# Patient Record
Sex: Female | Born: 2000 | Race: Black or African American | Hispanic: No | Marital: Single | Smoking: Never smoker
Health system: Southern US, Community
[De-identification: ages and names within clinical notes are randomized; demographics above are authoritative.]

## PROBLEM LIST (undated history)

## (undated) HISTORY — PX: MOUTH SURGERY: SHX715

---
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
## 2018-05-12 ENCOUNTER — Encounter: Payer: Self-pay | Admitting: Obstetrics and Gynecology

## 2018-05-12 ENCOUNTER — Ambulatory Visit (INDEPENDENT_AMBULATORY_CARE_PROVIDER_SITE_OTHER): Payer: Medicaid Other | Admitting: Obstetrics and Gynecology

## 2018-05-12 VITALS — BP 106/69 | HR 83 | Ht 69.0 in | Wt 142.0 lb

## 2018-05-12 DIAGNOSIS — Z309 Encounter for contraceptive management, unspecified: Secondary | ICD-10-CM

## 2018-05-12 DIAGNOSIS — Z3202 Encounter for pregnancy test, result negative: Secondary | ICD-10-CM

## 2018-05-12 DIAGNOSIS — Z3046 Encounter for surveillance of implantable subdermal contraceptive: Secondary | ICD-10-CM | POA: Diagnosis not present

## 2018-05-12 LAB — POCT URINE PREGNANCY: PREG TEST UR: NEGATIVE

## 2018-05-12 MED ORDER — ETONOGESTREL 68 MG ~~LOC~~ IMPL
68.0000 mg | DRUG_IMPLANT | Freq: Once | SUBCUTANEOUS | Status: AC
  Administered 2018-05-12: 68 mg via SUBCUTANEOUS

## 2018-05-12 NOTE — Progress Notes (Signed)
RGYN patient presents for consultation to discuss birth control options.  Pt is currently on Sprintec pt wants different method because she is not consistent w/ taking pills everyday.   Pt admits to not taking last 3-4 pills.  And has skipped pills before.   LMP: 04/11/18 when asked to leave urine sample pt states period has started today.    UPT:  NEGATIVE

## 2018-05-12 NOTE — Progress Notes (Signed)
     GYNECOLOGY OFFICE PROCEDURE NOTE  Glenda Fitzpatrick is a 17 y.o. G0P0000 here for Nexplanon insertion. No other gynecologic concerns. Denies unprotected intercourse within the last 14 days. UPT: negative  Reviewed risks of insertion of implant including risk of infection, bleeding, damage to surrounding tissues and organs, migration of implant, difficult removal. She verbalizes understanding and affirms desire to proceed. Consent signed.   Nexplanon Insertion Procedure Patient identified, informed consent performed, consent signed.   Patient does understand that irregular bleeding is a very common side effect of this medication. She was advised to have backup contraception for one week after placement. Pregnancy test in clinic today was negative.  An adequate timeout was performed.  Patient's left arm was prepped and draped in the usual sterile fashion. The ruler used to measure and mark insertion area.  Patient was prepped with alcohol swab and then injected with 3 ml of 1% lidocaine.  She was prepped with betadine, Nexplanon removed from packaging,  Device confirmed in needle, then inserted full length of needle and withdrawn per handbook instructions. Nexplanon was able to palpated in the patient's arm; patient palpated the insert herself. There was minimal blood loss.  Patient insertion site covered with guaze and a pressure bandage to reduce any bruising.  The patient tolerated the procedure well and was given post procedure instructions.    Device Info Exp: 08/2020 Lot#: [id number]  K. Therese SarahMeryl Ardit Danh, M.D. Attending Obstetrician & Gynecologist, [facility], [facility]: [facility]

## 2018-05-12 NOTE — Progress Notes (Signed)
   GYNECOLOGY OFFICE FOLLOW UP NOTE  History:  17 y.o. G0P0000 here today for follow up for counseling regarding contraception. Has been on OCPs and she is not good about taking pills.    History reviewed. No pertinent past medical history.  Past Surgical History:  Procedure Laterality Date  . MOUTH SURGERY      Current Outpatient Medications:  .  ibuprofen (ADVIL,MOTRIN) 800 MG tablet, Take 1 tablet (800 mg total) by mouth every 8 (eight) hours as needed., Disp: 60 tablet, Rfl: 3 .  norgestimate-ethinyl estradiol (ORTHO-CYCLEN,SPRINTEC,PREVIFEM) 0.25-35 MG-MCG tablet, Take 1 tablet by mouth daily., Disp: 3 Package, Rfl: 5  The following portions of the patient's history were reviewed and updated as appropriate: allergies, current medications, past family history, past medical history, past social history, past surgical history and problem list.   Review of Systems:  Pertinent items noted in HPI and remainder of comprehensive ROS otherwise negative.   Objective:  Physical Exam BP 106/69   Pulse 83   Ht 5\' 9"  (1.753 m)   Wt 142 lb (64.4 kg)   LMP 04/11/2018 (Approximate)   BMI 20.97 kg/m  CONSTITUTIONAL: Well-developed, well-nourished female in no acute distress.  HENT:  Normocephalic, atraumatic. External right and left ear normal. Oropharynx is clear and moist EYES: Conjunctivae and EOM are normal. Pupils are equal, round, and reactive to light. No scleral icterus.  NECK: Normal range of motion, supple, no masses SKIN: Skin is warm and dry. No rash noted. Not diaphoretic. No erythema. No pallor. NEUROLOGIC: Alert and oriented to person, place, and time. Normal reflexes, muscle tone coordination. No cranial nerve deficit noted. PSYCHIATRIC: Normal mood and affect. Normal behavior. Normal judgment and thought content. CARDIOVASCULAR: Normal heart rate noted RESPIRATORY: Effort normal, no problems with respiration noted ABDOMEN: Soft, no distention noted.   PELVIC:  Deferred MUSCULOSKELETAL: Normal range of motion. No edema noted.  Labs and Imaging No results found.  Assessment & Plan:  1. Encounter for contraceptive management, unspecified type Patient desires nexplanon for contraception, reviewed risks/benefits, she is agreeable. Consent signed.  - POCT urine pregnancy   Routine preventative health maintenance measures emphasized. Please refer to After Visit Summary for other counseling recommendations.   Return in about 1 year (around 05/13/2019), or if symptoms worsen or fail to improve.    Baldemar LenisK. Meryl August Longest, M.D. Attending Obstetrician & Gynecologist, [facility], [facility]

## 2018-05-30 ENCOUNTER — Telehealth: Payer: Self-pay

## 2018-05-30 NOTE — Telephone Encounter (Signed)
TC from pt mother regarding pt sx's since nexplanon insertion. Pt c/o : nausea and vomiting and increased fatigue.(pt states she does not want to go out the house) Pt states she is taking oral contraceptives to stop bleeding Pt states she is only having light spotting. I consulted w/ provider and advice to pt was to discontinue oral contraceptives and monitor sx's and pt made aware can have spotting 3-6 months.  Pt states w/o taking BCP's the sx's are worse. I asked pt if she wanted nexplanon removed  pt wants removed.and wants to go back to taking oral contraception only Pt advised to make an appt And discuss w/ provider since her mother advised her to get Nexplanon due to not staying consistent w/ taking birth control pills. Pt will be returning to college in Aug 2019.

## 2018-06-10 ENCOUNTER — Encounter: Payer: Self-pay | Admitting: General Practice

## 2018-06-10 ENCOUNTER — Encounter: Payer: Self-pay | Admitting: Medical

## 2018-06-10 ENCOUNTER — Ambulatory Visit (INDEPENDENT_AMBULATORY_CARE_PROVIDER_SITE_OTHER): Payer: Medicaid Other | Admitting: Medical

## 2018-06-10 VITALS — BP 111/72 | HR 91 | Ht 69.0 in | Wt 138.0 lb

## 2018-06-10 DIAGNOSIS — Z3009 Encounter for other general counseling and advice on contraception: Secondary | ICD-10-CM

## 2018-06-10 DIAGNOSIS — Z3046 Encounter for surveillance of implantable subdermal contraceptive: Secondary | ICD-10-CM

## 2018-06-10 MED ORDER — NORGESTIMATE-ETH ESTRADIOL 0.25-35 MG-MCG PO TABS: 1.0000 | ORAL_TABLET | Freq: Every day | ORAL | 5 refills | Status: AC

## 2018-06-10 NOTE — Progress Notes (Signed)
Pt here for nexplanon removal. Pt would like to continue on the sprintec

## 2018-06-10 NOTE — Patient Instructions (Signed)
Oral Contraception Information Oral contraceptive pills (OCPs) are medicines taken to prevent pregnancy. OCPs work by preventing the ovaries from releasing eggs. The hormones in OCPs also cause the cervical mucus to thicken, preventing the sperm from entering the uterus. The hormones also cause the uterine lining to become thin, not allowing a fertilized egg to attach to the inside of the uterus. OCPs are highly effective when taken exactly as prescribed. However, OCPs do not prevent sexually transmitted diseases (STDs). Safe sex practices, such as using condoms along with the pill, can help prevent STDs. Before taking the pill, you may have a physical exam and Pap test. Your health care provider may order blood tests. The health care provider will make sure you are a good candidate for oral contraception. Discuss with your health care provider the possible side effects of the OCP you may be prescribed. When starting an OCP, it can take 2 to 3 months for the body to adjust to the changes in hormone levels in your body. Types of oral contraception  The combination pill-This pill contains estrogen and progestin (synthetic progesterone) hormones. The combination pill comes in 21-day, 28-day, or 91-day packs. Some types of combination pills are meant to be taken continuously (365-day pills). With 21-day packs, you do not take pills for 7 days after the last pill. With 28-day packs, the pill is taken every day. The last 7 pills are without hormones. Certain types of pills have more than 21 hormone-containing pills. With 91-day packs, the first 84 pills contain both hormones, and the last 7 pills contain no hormones or contain estrogen only.  The minipill-This pill contains the progesterone hormone only. The pill is taken every day continuously. It is very important to take the pill at the same time each day. The minipill comes in packs of 28 pills. All 28 pills contain the hormone. Advantages of oral  contraceptive pills  Decreases premenstrual symptoms.  Treats menstrual period cramps.  Regulates the menstrual cycle.  Decreases a heavy menstrual flow.  May treatacne, depending on the type of pill.  Treats abnormal uterine bleeding.  Treats polycystic ovarian syndrome.  Treats endometriosis.  Can be used as emergency contraception. Things that can make oral contraceptive pills less effective OCPs can be less effective if:  You forget to take the pill at the same time every day.  You have a stomach or intestinal disease that lessens the absorption of the pill.  You take OCPs with other medicines that make OCPs less effective, such as antibiotics, certain HIV medicines, and some seizure medicines.  You take expired OCPs.  You forget to restart the pill on day 7, when using the packs of 21 pills.  Risks associated with oral contraceptive pills Oral contraceptive pills can sometimes cause side effects, such as:  Headache.  Nausea.  Breast tenderness.  Irregular bleeding or spotting.  Combination pills are also associated with a small increased risk of:  Blood clots.  Heart attack.  Stroke.  This information is not intended to replace advice given to you by your health care provider. Make sure you discuss any questions you have with your health care provider. Document Released: 01/26/2003 Document Revised: 04/12/2016 Document Reviewed: 04/26/2013 Elsevier Interactive Patient Education  2018 Elsevier Inc.  

## 2018-06-10 NOTE — Progress Notes (Signed)
GYNECOLOGY CLINIC PROCEDURE NOTE  Ms. Glenda Fitzpatrick is a 17 y.o. G0P0000 here for Nexplanon removal. No GYN concerns.  Last pap smear: N/A, under 17 y.o.No other gynecologic concerns. Has been on Sprintec OCPs for breakthrough bleeding and plans to continue for birth control.   Nexplanon Removal Patient was given informed consent for removal of her Nexplanon.  Appropriate time out taken. Nexplanon site identified.  Area prepped in usual sterile fashon. One ml of 1% lidocaine was used to anesthetize the area at the distal end of the implant. A small stab incision was made right beside the implant on the distal portion.  The Nexplanon rod was grasped using hemostats and removed without difficulty.  There was minimal blood loss. There were no complications.  A small amount of antibiotic ointment and steri-strips were applied over the small incision.  A pressure bandage was applied to reduce any bruising.  The patient tolerated the procedure well and was given post procedure instructions.  Patient is planning to use OCPs for contraception. Follow-up in 1 year for annual exam and birth control refill.    Marny LowensteinWenzel, Scarlet Abad N, PA-C 06/10/2018 8:38 AM

## 2018-06-11 ENCOUNTER — Encounter (HOSPITAL_COMMUNITY): Payer: Self-pay | Admitting: Radiology

## 2018-06-11 ENCOUNTER — Other Ambulatory Visit: Payer: Self-pay

## 2018-06-11 ENCOUNTER — Observation Stay (HOSPITAL_BASED_OUTPATIENT_CLINIC_OR_DEPARTMENT_OTHER): Payer: Medicaid Other

## 2018-06-11 ENCOUNTER — Observation Stay (HOSPITAL_COMMUNITY): Payer: Medicaid Other

## 2018-06-11 ENCOUNTER — Observation Stay (HOSPITAL_COMMUNITY)
Admission: AD | Admit: 2018-06-11 | Discharge: 2018-06-12 | Disposition: A | Discharge status: Home / Self Care | Payer: Medicaid Other | Source: Ambulatory Visit | Attending: Cardiovascular Disease | Admitting: Cardiovascular Disease

## 2018-06-11 DIAGNOSIS — R0602 Shortness of breath: Secondary | ICD-10-CM

## 2018-06-11 DIAGNOSIS — Z8249 Family history of ischemic heart disease and other diseases of the circulatory system: Secondary | ICD-10-CM | POA: Insufficient documentation

## 2018-06-11 DIAGNOSIS — N390 Urinary tract infection, site not specified: Secondary | ICD-10-CM | POA: Insufficient documentation

## 2018-06-11 DIAGNOSIS — Z793 Long term (current) use of hormonal contraceptives: Secondary | ICD-10-CM | POA: Insufficient documentation

## 2018-06-11 DIAGNOSIS — Z833 Family history of diabetes mellitus: Secondary | ICD-10-CM | POA: Insufficient documentation

## 2018-06-11 DIAGNOSIS — M79609 Pain in unspecified limb: Secondary | ICD-10-CM | POA: Diagnosis not present

## 2018-06-11 DIAGNOSIS — M79662 Pain in left lower leg: Secondary | ICD-10-CM | POA: Diagnosis not present

## 2018-06-11 LAB — URINALYSIS, ROUTINE W REFLEX MICROSCOPIC
Bilirubin Urine: NEGATIVE
Glucose, UA: NEGATIVE mg/dL
Hgb urine dipstick: NEGATIVE
Ketones, ur: NEGATIVE mg/dL
Nitrite: NEGATIVE
Protein, ur: NEGATIVE mg/dL
Specific Gravity, Urine: >1.046 — ABNORMAL HIGH (ref 1.005–1.030)
pH: 6 (ref 5.0–8.0)

## 2018-06-11 LAB — COMPREHENSIVE METABOLIC PANEL
ALT: 63 U/L — ABNORMAL HIGH (ref 0–44)
ANION GAP: 8 (ref 5–15)
AST: 36 U/L (ref 15–41)
Albumin: 4 g/dL (ref 3.5–5.0)
Alkaline Phosphatase: 72 U/L (ref 38–126)
BILIRUBIN TOTAL: 0.7 mg/dL (ref 0.3–1.2)
BUN: 8 mg/dL (ref 6–20)
CO2: 22 mmol/L (ref 22–32)
Calcium: 9.1 mg/dL (ref 8.9–10.3)
Chloride: 109 mmol/L (ref 98–111)
Creatinine, Ser: 0.8 mg/dL (ref 0.44–1.00)
GFR calc Af Amer: >60 mL/min (ref >60)
Glucose, Bld: 126 mg/dL — ABNORMAL HIGH (ref 70–99)
POTASSIUM: 3.7 mmol/L (ref 3.5–5.1)
Sodium: 139 mmol/L (ref 135–145)
Total Protein: 7 g/dL (ref 6.5–8.1)

## 2018-06-11 LAB — CBC WITH DIFFERENTIAL/PLATELET
ABS IMMATURE GRANULOCYTES: 0 10*3/uL (ref 0.0–0.1)
Basophils Absolute: 0 10*3/uL (ref 0.0–0.1)
Basophils Relative: 1 %
Eosinophils Absolute: 0.3 10*3/uL (ref 0.0–0.7)
Eosinophils Relative: 6 %
HCT: 37.6 % (ref 36.0–46.0)
HEMOGLOBIN: 12.2 g/dL (ref 12.0–15.0)
Immature Granulocytes: 0 %
LYMPHS PCT: 30 %
Lymphs Abs: 1.4 10*3/uL (ref 0.7–4.0)
MCH: 30.1 pg (ref 26.0–34.0)
MCHC: 32.4 g/dL (ref 30.0–36.0)
MCV: 92.8 fL (ref 78.0–100.0)
Monocytes Absolute: 0.6 10*3/uL (ref 0.1–1.0)
Monocytes Relative: 13 %
NEUTROS ABS: 2.2 10*3/uL (ref 1.7–7.7)
Neutrophils Relative %: 50 %
Platelets: 324 10*3/uL (ref 150–400)
RBC: 4.05 MIL/uL (ref 3.87–5.11)
RDW: 11.4 % — ABNORMAL LOW (ref 11.5–15.5)
WBC: 4.4 10*3/uL (ref 4.0–10.5)

## 2018-06-11 LAB — FOLATE: FOLATE: 15.6 ng/mL (ref >5.9)

## 2018-06-11 LAB — RETICULOCYTES
RBC.: 4.05 MIL/uL (ref 3.87–5.11)
Retic Count, Absolute: 93.2 10*3/uL (ref 19.0–186.0)
Retic Ct Pct: 2.3 % (ref 0.4–3.1)

## 2018-06-11 LAB — HCG, SERUM, QUALITATIVE: Preg, Serum: NEGATIVE

## 2018-06-11 LAB — IRON AND TIBC
Iron: 90 ug/dL (ref 28–170)
Saturation Ratios: 21 % (ref 10.4–31.8)
TIBC: 421 ug/dL (ref 250–450)
UIBC: 331 ug/dL

## 2018-06-11 LAB — FERRITIN: Ferritin: 24 ng/mL (ref 11–307)

## 2018-06-11 LAB — VITAMIN B12: VITAMIN B 12: 722 pg/mL (ref 180–914)

## 2018-06-11 LAB — D-DIMER, QUANTITATIVE (NOT AT ARMC)

## 2018-06-11 MED ORDER — IOPAMIDOL (ISOVUE-370) INJECTION 76%
100.0000 mL | Freq: Once | INTRAVENOUS | Status: AC | PRN
  Administered 2018-06-11: 100 mL via INTRAVENOUS

## 2018-06-11 MED ORDER — CIPROFLOXACIN HCL 500 MG PO TABS
250.0000 mg | ORAL_TABLET | Freq: Two times a day (BID) | ORAL | Status: DC
  Administered 2018-06-11 – 2018-06-12 (×2): 250 mg via ORAL
  Filled 2018-06-11 (×2): qty 1

## 2018-06-11 MED ORDER — HEPARIN SODIUM (PORCINE) 5000 UNIT/ML IJ SOLN
5000.0000 [IU] | Freq: Three times a day (TID) | INTRAMUSCULAR | Status: DC
  Administered 2018-06-11 – 2018-06-12 (×3): 5000 [IU] via SUBCUTANEOUS
  Filled 2018-06-11 (×3): qty 1

## 2018-06-11 MED ORDER — IOPAMIDOL (ISOVUE-370) INJECTION 76%
INTRAVENOUS | Status: AC
  Filled 2018-06-11: qty 100

## 2018-06-11 NOTE — Progress Notes (Signed)
Glenda Fitzpatrick arrived from Dr. Orpah CobbAjay Kadakia medical office as a direct admit for SOB, R/O for PE, DVT. Glenda Fitzpatrick A&O x4, calm, cooperative, ambulating independently. Orders received, IV Access achieved in R upper FA. Glenda Fitzpatrick in hospital gown oriented to room, telemetry applied, SR on Monitor. Mom at bedside and updated on POC. Will continue to monitor.

## 2018-06-11 NOTE — Progress Notes (Signed)
Discussed lab results with patient including negative work up for PE and DVT. She also has normal iron level and low normal Hgb level. She may have early UTI with pelvic pressure and burning on urination with positive leukocytes on urinalysis. Will start ciprofloxacin 250 mg. Bid x 5 days. Will also check TSH and T4 for c/o tired all the time with cold intolerance.  Orpah CobbAjay Ely Ballen, MD 06/11/2018, 08:00 PM.

## 2018-06-11 NOTE — H&P (Signed)
Referring Physician:   Winona LegatoKennedy Fitzpatrick is an 17 y.o. female.                       Chief Complaint: Shortness of breath  HPI: 17 year old female with PMH of BC pills use recently following Progestrone SQ implant removal for significant nausea, vomiting and weight loss has 2 month history of shortness of breath with activity progressively worsening. She also has left calf tenderness on palpation. She denies fever, cough or chest pain. She was tachycardic and hypoxic by walking 100 feet in office. She has family history of DVT and hypertension.   No past medical history on file.    Past Surgical History:  Procedure Laterality Date  . MOUTH SURGERY      Family History  Problem Relation Age of Onset  . Hypertension Mother   . Hypertension Maternal Grandmother   . Diabetes Mellitus II Maternal Grandmother    Social History:  reports that she has never smoked. She has never used smokeless tobacco. She reports that she does not drink alcohol or use drugs.  Allergies: No Known Allergies  Medications Prior to Admission  Medication Sig Dispense Refill  . ibuprofen (ADVIL,MOTRIN) 800 MG tablet Take 1 tablet (800 mg total) by mouth every 8 (eight) hours as needed. 60 tablet 3  . norgestimate-ethinyl estradiol (ORTHO-CYCLEN,SPRINTEC,PREVIFEM) 0.25-35 MG-MCG tablet Take 1 tablet by mouth daily. 3 Package 5    No results found for this or any previous visit (from the past 48 hour(s)). No results found.  Review Of Systems Constitutional: No fever, chills, some weight loss. Eyes: No vision change, wears glasses. No discharge or pain. Ears: No hearing loss, No tinnitus. Respiratory: No asthma, COPD, pneumonias. Positive shortness of breath. No hemoptysis. Cardiovascular: No chest pain, palpitation, leg edema. Gastrointestinal: Positive nausea, vomiting, no diarrhea, no constipation. No GI bleed. No hepatitis. Genitourinary: No dysuria, hematuria, kidney stone. No incontinance. Neurological:  No headache, stroke, seizures.  Psychiatry: No psych facility admission for anxiety, depression, suicide. No detox. Skin: No rash. Musculoskeletal: No joint pain, fibromyalgia. No neck pain, back pain. Lymphadenopathy: No lymphadenopathy. Hematology: Positive anemia. No easy bruising.   There were no vitals taken for this visit. There is no height or weight on file to calculate BMI. General appearance: alert, cooperative, appears stated age and mild respiratory distress Head: Normocephalic, atraumatic. Eyes: Brown eyes, pale conjunctiva, corneas clear. PERRL, EOM's intact. Neck: No adenopathy, no carotid bruit, no JVD, supple, symmetrical, trachea midline and thyroid not enlarged. Resp: Clear to auscultation bilaterally. Cardio: Regular rate and rhythm, S1, S2 normal, II/VI systolic murmur, no click, rub or gallop GI: Soft, non-tender; bowel sounds normal; no organomegaly. Extremities: No edema, cyanosis or clubbing. Left calf tenderness on palpation. Skin: Warm and dry.  Neurologic: Alert and oriented X 3, normal strength. Normal coordination and gait.  Assessment/Plan Shortness of breath r/o PE Anemia, r/o iron deficiency Left calf tenderness r/o DVT  Place in observation. R/O DVT and PE. IV heparin. Blood work to r/o iron deficiency.  Glenda RodriguezAjay S Anzlee Hinesley, MD  06/11/2018, 2:03 PM

## 2018-06-11 NOTE — Progress Notes (Signed)
*  Preliminary Results* Left lower extremity venous duplex completed. Left lower extremity is negative for deep vein thrombosis. There is no evidence of left Baker's cyst.  06/11/2018 3:20 PM  Aundra MilletMegan Clare Gandyiddle

## 2018-06-12 DIAGNOSIS — R0602 Shortness of breath: Secondary | ICD-10-CM | POA: Diagnosis not present

## 2018-06-12 LAB — BASIC METABOLIC PANEL
ANION GAP: 7 (ref 5–15)
BUN: 9 mg/dL (ref 6–20)
CALCIUM: 9 mg/dL (ref 8.9–10.3)
CO2: 25 mmol/L (ref 22–32)
Chloride: 108 mmol/L (ref 98–111)
Creatinine, Ser: 0.76 mg/dL (ref 0.44–1.00)
Glucose, Bld: 95 mg/dL (ref 70–99)
Potassium: 3.7 mmol/L (ref 3.5–5.1)
Sodium: 140 mmol/L (ref 135–145)

## 2018-06-12 LAB — CBC
HCT: 37.4 % (ref 36.0–46.0)
HEMOGLOBIN: 11.9 g/dL — AB (ref 12.0–15.0)
MCH: 30.1 pg (ref 26.0–34.0)
MCHC: 31.8 g/dL (ref 30.0–36.0)
MCV: 94.7 fL (ref 78.0–100.0)
PLATELETS: 328 10*3/uL (ref 150–400)
RBC: 3.95 MIL/uL (ref 3.87–5.11)
RDW: 11.5 % (ref 11.5–15.5)
WBC: 4.6 10*3/uL (ref 4.0–10.5)

## 2018-06-12 LAB — HIV ANTIBODY (ROUTINE TESTING W REFLEX): HIV Screen 4th Generation wRfx: NONREACTIVE

## 2018-06-12 LAB — T4, FREE: FREE T4: 0.97 ng/dL (ref 0.82–1.77)

## 2018-06-12 LAB — TSH: TSH: 0.926 u[IU]/mL (ref 0.350–4.500)

## 2018-06-12 MED ORDER — CIPROFLOXACIN HCL 250 MG PO TABS: 250.0000 mg | ORAL_TABLET | Freq: Two times a day (BID) | ORAL | 0 refills | Status: AC

## 2018-06-12 NOTE — Discharge Summary (Signed)
Physician Discharge Summary  Patient ID: Glenda Fitzpatrick MRN: [id number] DOB/AGE: [birth year] 17 y.o.  Admit date: 06/11/2018 Discharge date: 06/12/2018  Admission Diagnoses: Shortness of breath Discharge Diagnoses:  Principal Problem:   Shortness of breath on exertion Active Problems:   Early UTI   PE ruled out   DVT ruled out   Hypothyroidism ruled out  Discharged Condition: good  Hospital Course: 17 year old female c/o nausea, vomiting, shortness of breath x 2 months. She has history of heavy periods until she got Progesterone SQ implant which gave her nausea, vomiting and loss of appetite. She is now on oral BC pills. She was tachycardic and hypoxic in the office and appeared pale but her work up is negative for PE, DVT, significant anemia or hypothyroidism.  She had early UTI and was treated with PO ciprofloxacin.  She was discharged home in stable condition. She will see me in 1 week for f/u on UTI. She will increase her activity gradually.  Consults: None  Significant Diagnostic Studies: labs: Near normal CBC, CMET, T4 and TSH and  D-dimer. UA-early UTI with mildly elevated WBCs.  EKG-SR with occasional APCs.  CT angio chest : No PE.  Treatments: antibiotics: Cipro.  Discharge Exam: Blood pressure 119/72, pulse 81, temperature 98.9 F (37.2 C), temperature source Oral, resp. rate 16, height 5\' 9"  (1.753 m), weight 62.9 kg (138 lb 9.6 oz), SpO2 99 %. General appearance: alert, cooperative and appears stated age. Head: Normocephalic, atraumatic. Eyes: Brown eyes, pale pink conjunctiva, corneas clear. PERRL, EOM's intact.  Neck: No adenopathy, no carotid bruit, no JVD, supple, symmetrical, trachea midline and thyroid not enlarged. Resp: Clear to auscultation bilaterally. Cardio: Regular rate and rhythm, S1, S2 normal, II/VI systolic murmur, no click, rub or gallop. GI: Soft, non-tender; bowel sounds normal; no organomegaly. Extremities: No edema, cyanosis or  clubbing. Skin: Warm and dry.  Neurologic: Alert and oriented X 3, normal strength and tone. Normal coordination and gait.  Disposition: Discharge disposition: 01-Home or Self Care        Allergies as of 06/12/2018   No Known Allergies     Medication List    TAKE these medications   ciprofloxacin 250 MG tablet Commonly known as:  CIPRO Take 1 tablet (250 mg total) by mouth 2 (two) times daily.   norgestimate-ethinyl estradiol 0.25-35 MG-MCG tablet Commonly known as:  ORTHO-CYCLEN,SPRINTEC,PREVIFEM Take 1 tablet by mouth daily.      Follow-up Information    Orpah CobbKadakia, Ladanian Kelter, MD. Call in 1 week(s).   Specialty:  Cardiology Why:  UTI f/u Contact information: [address] [place] [address] [phone]           Signed: Ricki Rodriguezjay S Jadarian Mckay 06/12/2018, 8:02 AM

## 2018-06-12 NOTE — Care Management Note (Signed)
Case Management Note  Patient Details  Name: Glenda Fitzpatrick MRN: [id number] Date of Birth: 04-05-2001  Subjective/Objective:    Presents with c/o nausea, vomiting, shortness of breath x 2 months. She has history of heavy periods until she got Progesterone SQ implant which gave her nausea, vomiting and loss of appetite. She is now on oral BC pills. She was tachycardic and hypoxic in the office and appeared pale but her work up is negative for PE, DVT, significant anemia or hypothyroidism.  She had early UTI and was treated with PO ciprofloxacin.  For dc home, her pcp is Dr. Algie CofferKadakia.                  Action/Plan: DC home when ready.  Expected Discharge Date:  06/12/18               Expected Discharge Plan:  Home/Self Care  In-House Referral:     Discharge planning Services  CM Consult  Post Acute Care Choice:    Choice offered to:     DME Arranged:    DME Agency:     HH Arranged:    HH Agency:     Status of Service:  Completed, signed off  If discussed at [employer] Length of Stay Meetings, dates discussed:    Additional Comments:  Leone Havenaylor, Lynnmarie Lovett Clinton, RN 06/12/2018, 10:29 AM

## 2019-09-16 ENCOUNTER — Other Ambulatory Visit: Payer: Self-pay

## 2019-09-16 ENCOUNTER — Emergency Department (HOSPITAL_COMMUNITY)
Admission: EM | Admit: 2019-09-16 | Discharge: 2019-09-16 | Disposition: A | Discharge status: Home / Self Care | Payer: Medicaid Other | Attending: Emergency Medicine | Admitting: Emergency Medicine

## 2019-09-16 ENCOUNTER — Encounter (HOSPITAL_COMMUNITY): Payer: Self-pay

## 2019-09-16 DIAGNOSIS — N764 Abscess of vulva: Secondary | ICD-10-CM | POA: Diagnosis present

## 2019-09-16 DIAGNOSIS — L0390 Cellulitis, unspecified: Secondary | ICD-10-CM

## 2019-09-16 LAB — CBC WITH DIFFERENTIAL/PLATELET
Abs Immature Granulocytes: 0.04 10*3/uL (ref 0.00–0.07)
Basophils Absolute: 0 10*3/uL (ref 0.0–0.1)
Basophils Relative: 0 %
Eosinophils Absolute: 0.1 10*3/uL (ref 0.0–0.5)
Eosinophils Relative: 1 %
HCT: 39.1 % (ref 36.0–46.0)
Hemoglobin: 12.6 g/dL (ref 12.0–15.0)
Immature Granulocytes: 0 %
Lymphocytes Relative: 14 %
Lymphs Abs: 1.6 10*3/uL (ref 0.7–4.0)
MCH: 30.2 pg (ref 26.0–34.0)
MCHC: 32.2 g/dL (ref 30.0–36.0)
MCV: 93.8 fL (ref 80.0–100.0)
Monocytes Absolute: 1.1 10*3/uL — ABNORMAL HIGH (ref 0.1–1.0)
Monocytes Relative: 10 %
Neutro Abs: 8.1 10*3/uL — ABNORMAL HIGH (ref 1.7–7.7)
Neutrophils Relative %: 75 %
Platelets: 344 10*3/uL (ref 150–400)
RBC: 4.17 MIL/uL (ref 3.87–5.11)
RDW: 11.2 % — ABNORMAL LOW (ref 11.5–15.5)
WBC: 11 10*3/uL — ABNORMAL HIGH (ref 4.0–10.5)
nRBC: 0 % (ref 0.0–0.2)

## 2019-09-16 LAB — BASIC METABOLIC PANEL
Anion gap: 10 (ref 5–15)
BUN: 14 mg/dL (ref 6–20)
CO2: 24 mmol/L (ref 22–32)
Calcium: 9.6 mg/dL (ref 8.9–10.3)
Chloride: 104 mmol/L (ref 98–111)
Creatinine, Ser: 0.82 mg/dL (ref 0.44–1.00)
GFR calc Af Amer: >60 mL/min (ref >60)
GFR calc non Af Amer: >60 mL/min (ref >60)
Glucose, Bld: 89 mg/dL (ref 70–99)
Potassium: 3.8 mmol/L (ref 3.5–5.1)
Sodium: 138 mmol/L (ref 135–145)

## 2019-09-16 MED ORDER — SULFAMETHOXAZOLE-TRIMETHOPRIM 800-160 MG PO TABS: 1.0000 | ORAL_TABLET | Freq: Two times a day (BID) | ORAL | 0 refills | Status: AC

## 2019-09-16 MED ORDER — ONDANSETRON HCL 4 MG/2ML IJ SOLN
4.0000 mg | Freq: Once | INTRAMUSCULAR | Status: AC
  Administered 2019-09-16: 4 mg via INTRAVENOUS
  Filled 2019-09-16: qty 2

## 2019-09-16 MED ORDER — MORPHINE SULFATE (PF) 2 MG/ML IV SOLN
2.0000 mg | Freq: Once | INTRAVENOUS | Status: AC
  Administered 2019-09-16: 2 mg via INTRAVENOUS
  Filled 2019-09-16: qty 1

## 2019-09-16 MED ORDER — HYDROCODONE-ACETAMINOPHEN 5-325 MG PO TABS: 1.0000 | ORAL_TABLET | Freq: Four times a day (QID) | ORAL | 0 refills | Status: AC | PRN

## 2019-09-16 MED ORDER — CLINDAMYCIN PHOSPHATE 600 MG/50ML IV SOLN
600.0000 mg | Freq: Once | INTRAVENOUS | Status: AC
  Administered 2019-09-16: 600 mg via INTRAVENOUS
  Filled 2019-09-16: qty 50

## 2019-09-16 MED ORDER — LIDOCAINE HCL (PF) 1 % IJ SOLN
30.0000 mL | Freq: Once | INTRAMUSCULAR | Status: AC
  Administered 2019-09-16: 30 mL
  Filled 2019-09-16: qty 30

## 2019-09-16 MED ORDER — ACETAMINOPHEN 325 MG PO TABS: 650.0000 mg | ORAL_TABLET | Freq: Once | ORAL | Status: DC

## 2019-09-16 NOTE — ED Triage Notes (Signed)
Onset 3 days boil on left side of labia.  Has drained small amount.  Very painful.

## 2019-09-16 NOTE — ED Notes (Signed)
Patient verbalizes understanding of discharge instructions. Opportunity for questioning and answers were provided. Armband removed by staff, pt discharged from ED ambulatory.   

## 2019-09-16 NOTE — Discharge Instructions (Addendum)
1. Medications: Tylenol or ibuprofen for pain, usual home medications -Prescription for Bactrim sent to the pharmacy.  This is an antibiotic used to treat skin infections.  Please take as prescribed. -Prescription also sent for Norco.  This is a narcotic pain medicine.  This is used for severe pain only.  Do not take Tylenol while taking this medication has Tylenol in it already.  Not drive or work while taking this medication as it can make you drowsy.  2. Treatment: ice for swelling, keep wound clean with warm soap and water and keep bandage dry, do not submerge in water for 24 hours  3. Follow Up: Please return in 2 days to have your wound rechecked. Return to the emergency department for increased redness, drainage of pus from the wound   WOUND CARE  Keep area clean and dry for 24 hours. Do not remove bandage, if applied.  After 24 hours, remove bandage and wash wound gently with mild soap and warm water. Reapply a new bandage after cleaning wound, if directed.   Continue daily cleansing with soap and water daily Return if you experience any of the following signs of infection: Swelling, redness, pus drainage, streaking, fever >101.0 F  Return if you experience excessive bleeding that does not stop after 15-20 minutes of constant, firm pressure.

## 2019-09-16 NOTE — ED Provider Notes (Signed)
[facility] EMERGENCY DEPARTMENT Provider Note   CSN: [id number] Arrival date & time: 09/16/19  1816     History   Chief Complaint Chief Complaint  Patient presents with  . Abscess    HPI Glenda Fitzpatrick is a 18 y.o. female with no known medical history presents to emergency department today with chief complaint of abscess. Pt reports she noticed a boil on the left side of labia x 3 day ago. She reports localized pain to boil as severe, and throbbing. Rates pain 10/10 in severity. She has tried soaking in warm baths with minimal pain relief. She also reports using disposable razor to shave. She has had multiple ingrown hairs in the past. She thinks this current boil started after cutting her left labia shaving last week. It has been spontaneously draining with yellow pus.  She reports tetanus is UTD. She denies fever, chills, nausea, vomiting, abdominal pain, gross hematuria, vaginal discharge, dysuria.    History reviewed. No pertinent past medical history.  Patient Active Problem List   Diagnosis Date Noted  . Shortness of breath on exertion 06/11/2018    Class: Acute  . Shortness of breath 06/11/2018    Past Surgical History:  Procedure Laterality Date  . MOUTH SURGERY       OB History    Gravida  0   Para  0   Term  0   Preterm  0   AB  0   Living  0     SAB  0   TAB  0   Ectopic  0   Multiple  0   Live Births  0            Home Medications    Prior to Admission medications   Medication Sig Start Date End Date Taking? Authorizing Provider  ciprofloxacin (CIPRO) 250 MG tablet Take 1 tablet (250 mg total) by mouth 2 (two) times daily. 06/12/18   Orpah Cobb, MD  HYDROcodone-acetaminophen (NORCO/VICODIN) 5-325 MG tablet Take 1 tablet by mouth every 6 (six) hours as needed for severe pain. 09/16/19   Chiquetta Langner E, PA-C  norgestimate-ethinyl estradiol (ORTHO-CYCLEN,SPRINTEC,PREVIFEM) 0.25-35 MG-MCG tablet Take 1 tablet  by mouth daily. 06/10/18   Marny Lowenstein, PA-C  sulfamethoxazole-trimethoprim (BACTRIM DS) 800-160 MG tablet Take 1 tablet by mouth 2 (two) times daily for 7 days. 09/16/19 09/23/19  Dayanira Giovannetti, Caroleen Hamman, PA-C    Family History Family History  Problem Relation Age of Onset  . Hypertension Mother   . Hypertension Maternal Grandmother   . Diabetes Mellitus II Maternal Grandmother     Social History Social History   Tobacco Use  . Smoking status: Never Smoker  . Smokeless tobacco: Never Used  Substance Use Topics  . Alcohol use: No  . Drug use: No     Allergies   Patient has no known allergies.   Review of Systems Review of Systems  Constitutional: Negative for chills, fever and unexpected weight change.  Gastrointestinal: Negative for abdominal pain, nausea, rectal pain and vomiting.  Genitourinary: Positive for vaginal pain. Negative for dysuria, frequency, genital sores, hematuria, pelvic pain, vaginal bleeding and vaginal discharge.  Skin: Positive for wound.     Physical Exam Updated Vital Signs BP (!) 96/59   Pulse 85   Temp 97.9 F (36.6 C) (Oral)   Resp 16   SpO2 98%   Physical Exam Vitals signs and nursing note reviewed.  Constitutional:      General: She is not  in acute distress.    Appearance: She is not ill-appearing.  HENT:     Head: Normocephalic and atraumatic.     Right Ear: Tympanic membrane and external ear normal.     Left Ear: Tympanic membrane and external ear normal.     Nose: Nose normal.     Mouth/Throat:     Mouth: Mucous membranes are moist.     Pharynx: Oropharynx is clear.  Eyes:     General: No scleral icterus.       Right eye: No discharge.        Left eye: No discharge.     Extraocular Movements: Extraocular movements intact.     Conjunctiva/sclera: Conjunctivae normal.     Pupils: Pupils are equal, round, and reactive to light.  Neck:     Musculoskeletal: Normal range of motion.     Vascular: No JVD.  Cardiovascular:      Rate and Rhythm: Normal rate and regular rhythm.     Pulses: Normal pulses.          Radial pulses are 2+ on the right side and 2+ on the left side.     Heart sounds: Normal heart sounds.  Pulmonary:     Comments: Lungs clear to auscultation in all fields. Symmetric chest rise. No wheezing, rales, or rhonchi. Abdominal:     Comments: Abdomen is soft, non-distended, and non-tender in all quadrants. No rigidity, no guarding. No peritoneal signs.  Genitourinary:   Musculoskeletal: Normal range of motion.  Skin:    General: Skin is warm and dry.     Capillary Refill: Capillary refill takes less than 2 seconds.  Neurological:     Mental Status: She is oriented to person, place, and time.     GCS: GCS eye subscore is 4. GCS verbal subscore is 5. GCS motor subscore is 6.     Comments: Fluent speech, no facial droop.  Psychiatric:        Behavior: Behavior normal.      ED Treatments / Results  Labs (all labs ordered are listed, but only abnormal results are displayed) Labs Reviewed  CBC WITH DIFFERENTIAL/PLATELET - Abnormal; Notable for the following components:      Result Value   WBC 11.0 (*)    RDW 11.2 (*)    Neutro Abs 8.1 (*)    Monocytes Absolute 1.1 (*)    All other components within normal limits  BASIC METABOLIC PANEL    EKG None  Radiology No results found.  Procedures .Marland Kitchen.Incision and Drainage  Date/Time: 09/16/2019 11:13 PM Performed by: Sherene SiresAlbrizze, Coty Student E, PA-C Authorized by: Sherene SiresAlbrizze, Lymon Kidney E, PA-C   Consent:    Consent obtained:  Verbal   Consent given by:  Patient   Risks discussed:  Bleeding, incomplete drainage and infection   Alternatives discussed:  No treatment and delayed treatment Location:    Type:  Abscess   Size:  3x3   Location:  Anogenital   Anogenital location:  Vulva Pre-procedure details:    Skin preparation:  Chloraprep Anesthesia (see MAR for exact dosages):    Anesthesia method:  Local infiltration   Local anesthetic:   Lidocaine 1% WITH epi Procedure type:    Complexity:  Simple Procedure details:    Incision types:  Single straight   Incision depth:  Dermal   Scalpel blade:  11   Wound management:  Probed and deloculated and irrigated with saline   Drainage:  Serosanguinous   Drainage amount:  Moderate  Wound treatment:  Wound left open   Packing materials:  None Post-procedure details:    Patient tolerance of procedure:  Tolerated well, no immediate complications   (including critical care time)  Medications Ordered in ED Medications  morphine 2 MG/ML injection 2 mg (2 mg Intravenous Given 09/16/19 2128)  ondansetron (ZOFRAN) injection 4 mg (4 mg Intravenous Given 09/16/19 2128)  clindamycin (CLEOCIN) IVPB 600 mg (0 mg Intravenous Stopped 09/16/19 2235)  lidocaine (PF) (XYLOCAINE) 1 % injection 30 mL (30 mLs Infiltration Given 09/16/19 2159)     Initial Impression / Assessment and Plan / ED Course  I have reviewed the triage vital signs and the nursing notes.  Pertinent labs & imaging results that were available during my care of the patient were reviewed by me and considered in my medical decision making (see chart for details).  Pt seen and examined. She is non toxic appearing, in no acute distress. She is afebrile, no tachycardia or signs of sepsis.  On exam she has an abscess to left labia major. Abscess is not consistent with bartholin cyst but appears to be cellulitic infection. Swelling of left labia majora noted.  Labs show leukocytosis of 11.  Otherwise are unremarkable. Pt given dose of IV clindamycin for cellulitis. I and D performed, pt tolerated well. Please see procedure note above. Pain has significantly improved after I &D. Given wound from razor is possible source, confirmed tetanus is UTD. Will discharge home with PO antibiotics and short course of norco for severe pain. I have reviewed the PDMP during this encounter. Pt has not recent prescriptions.  The patient appears  reasonably screened and/or stabilized for discharge and I doubt any other medical condition or other [facility] requiring further screening, evaluation, or treatment in the ED at this time prior to discharge. The patient is safe for discharge with strict return precautions discussed. Recommend pcp follow up as well as need to return for wound recheck in 2 days.  Encouraged home warm soaks and flushing. Findings and plan of care discussed with supervising physician Dr. Gilford Raid.    Portions of this note were generated with [employer]. Dictation errors may occur despite best attempts at proofreading.     Final Clinical Impressions(s) / ED Diagnoses   Final diagnoses:  Cellulitis, unspecified cellulitis site    ED Discharge Orders         Ordered    sulfamethoxazole-trimethoprim (BACTRIM DS) 800-160 MG tablet  2 times daily     09/16/19 2239    HYDROcodone-acetaminophen (NORCO/VICODIN) 5-325 MG tablet  Every 6 hours PRN     09/16/19 2256           Cherre Robins, PA-C 09/17/19 0152    Isla Pence, MD 09/20/19 1506

## 2021-12-03 ENCOUNTER — Emergency Department (HOSPITAL_COMMUNITY)
Admission: EM | Admit: 2021-12-03 | Discharge: 2021-12-04 | Disposition: A | Discharge status: Home / Self Care | Payer: Medicaid Other | Attending: Emergency Medicine | Admitting: Emergency Medicine

## 2021-12-03 ENCOUNTER — Encounter (HOSPITAL_COMMUNITY): Payer: Self-pay | Admitting: *Deleted

## 2021-12-03 ENCOUNTER — Other Ambulatory Visit: Payer: Self-pay

## 2021-12-03 DIAGNOSIS — Z20822 Contact with and (suspected) exposure to covid-19: Secondary | ICD-10-CM | POA: Insufficient documentation

## 2021-12-03 DIAGNOSIS — K529 Noninfective gastroenteritis and colitis, unspecified: Secondary | ICD-10-CM

## 2021-12-03 DIAGNOSIS — R112 Nausea with vomiting, unspecified: Secondary | ICD-10-CM | POA: Diagnosis present

## 2021-12-03 LAB — COMPREHENSIVE METABOLIC PANEL
ALT: 15 U/L (ref 0–44)
AST: 36 U/L (ref 15–41)
Albumin: 4.3 g/dL (ref 3.5–5.0)
Alkaline Phosphatase: 91 U/L (ref 38–126)
Anion gap: 7 (ref 5–15)
BUN: 16 mg/dL (ref 6–20)
CO2: 21 mmol/L — ABNORMAL LOW (ref 22–32)
Calcium: 8.4 mg/dL — ABNORMAL LOW (ref 8.9–10.3)
Chloride: 105 mmol/L (ref 98–111)
Creatinine, Ser: 0.88 mg/dL (ref 0.44–1.00)
GFR, Estimated: >60 mL/min (ref >60)
Glucose, Bld: 108 mg/dL — ABNORMAL HIGH (ref 70–99)
Potassium: 4.5 mmol/L (ref 3.5–5.1)
Sodium: 133 mmol/L — ABNORMAL LOW (ref 135–145)
Total Bilirubin: 0.7 mg/dL (ref 0.3–1.2)
Total Protein: 7.7 g/dL (ref 6.5–8.1)

## 2021-12-03 LAB — URINALYSIS, MICROSCOPIC (REFLEX)

## 2021-12-03 LAB — CBC WITH DIFFERENTIAL/PLATELET
Abs Immature Granulocytes: 0.02 10*3/uL (ref 0.00–0.07)
Basophils Absolute: 0 10*3/uL (ref 0.0–0.1)
Basophils Relative: 0 %
Eosinophils Absolute: 0.1 10*3/uL (ref 0.0–0.5)
Eosinophils Relative: 2 %
HCT: 44.8 % (ref 36.0–46.0)
Hemoglobin: 14.3 g/dL (ref 12.0–15.0)
Immature Granulocytes: 0 %
Lymphocytes Relative: 4 %
Lymphs Abs: 0.3 10*3/uL — ABNORMAL LOW (ref 0.7–4.0)
MCH: 30 pg (ref 26.0–34.0)
MCHC: 31.9 g/dL (ref 30.0–36.0)
MCV: 93.9 fL (ref 80.0–100.0)
Monocytes Absolute: 0.4 10*3/uL (ref 0.1–1.0)
Monocytes Relative: 6 %
Neutro Abs: 6.7 10*3/uL (ref 1.7–7.7)
Neutrophils Relative %: 88 %
Platelets: 308 10*3/uL (ref 150–400)
RBC: 4.77 MIL/uL (ref 3.87–5.11)
RDW: 11.3 % — ABNORMAL LOW (ref 11.5–15.5)
WBC: 7.6 10*3/uL (ref 4.0–10.5)
nRBC: 0 % (ref 0.0–0.2)

## 2021-12-03 LAB — URINALYSIS, ROUTINE W REFLEX MICROSCOPIC
Bilirubin Urine: NEGATIVE
Glucose, UA: NEGATIVE mg/dL
Hgb urine dipstick: NEGATIVE
Ketones, ur: 40 mg/dL — AB
Nitrite: NEGATIVE
Protein, ur: 30 mg/dL — AB
Specific Gravity, Urine: >1.03 — ABNORMAL HIGH (ref 1.005–1.030)
pH: 6 (ref 5.0–8.0)

## 2021-12-03 LAB — I-STAT BETA HCG BLOOD, ED (MC, WL, AP ONLY): I-stat hCG, quantitative: <5 m[IU]/mL (ref <5)

## 2021-12-03 MED ORDER — ONDANSETRON 4 MG PO TBDP
4.0000 mg | ORAL_TABLET | Freq: Once | ORAL | Status: AC
  Administered 2021-12-03: 4 mg via ORAL
  Filled 2021-12-03: qty 1

## 2021-12-03 NOTE — ED Provider Triage Note (Signed)
Emergency Medicine Provider Triage Evaluation Note  Glenda Fitzpatrick , a 21 y.o. female  was evaluated in triage.  Pt complains of nausea, vomiting, diarrhea and abdominal pain.  Also with subjective fever and shaking chills today.  Symptoms started after she woke up around noon.  She had diarrhea.  No blood noted.  After some time passed, she developed multiple episodes of vomiting which has persisted.  Prompted ED visit tonight.  Complains of generalized abdominal pain and does note that yesterday she had [place] food and street tacos and is concerned about food poisoning.  She has not missed any menstrual periods.  Review of Systems  Positive: Nausea, vomiting, diarrhea Negative: Dysuria  Physical Exam  BP 103/73 (BP Location: Right Arm)    Pulse (!) 116    Temp 98.6 F (37 C) (Oral)    Resp 14    SpO2 99%  Gen:   Awake, no distress   Resp:  Normal effort  MSK:   Moves extremities without difficulty  Other:  Abdomen is soft and nontender to palpation, no rebound or guarding  Medical Decision Making  Medically screening exam initiated at 9:58 PM.  Appropriate orders placed.  Deborah Lazcano was informed that the remainder of the evaluation will be completed by another provider, this initial triage assessment does not replace that evaluation, and the importance of remaining in the ED until their evaluation is complete.     Renne Crigler, PA-C 12/03/21 2159

## 2021-12-03 NOTE — ED Triage Notes (Signed)
The pt is c/o nausea vomiting and diarrhea  with dizziness  dec 24

## 2021-12-04 LAB — RESP PANEL BY RT-PCR (FLU A&B, COVID) ARPGX2
Influenza A by PCR: NEGATIVE
Influenza B by PCR: NEGATIVE
SARS Coronavirus 2 by RT PCR: NEGATIVE

## 2021-12-04 MED ORDER — ONDANSETRON 4 MG PO TBDP: ORAL_TABLET | ORAL | 0 refills | Status: AC

## 2021-12-04 MED ORDER — ONDANSETRON HCL 4 MG/2ML IJ SOLN
4.0000 mg | Freq: Once | INTRAMUSCULAR | Status: AC
  Administered 2021-12-04: 4 mg via INTRAVENOUS
  Filled 2021-12-04: qty 2

## 2021-12-04 MED ORDER — SODIUM CHLORIDE 0.9 % IV BOLUS
1000.0000 mL | Freq: Once | INTRAVENOUS | Status: AC
  Administered 2021-12-04: 1000 mL via INTRAVENOUS

## 2021-12-04 NOTE — ED Provider Notes (Signed)
[facility] EMERGENCY DEPARTMENT Provider Note   CSN: [id number] Arrival date & time: 12/03/21  2103     History  Chief Complaint  Patient presents with   Abdominal Pain    Glenda Fitzpatrick is a 21 y.o. female.  Patient is a 21 year old female who presents with nausea vomiting and diarrhea.  She says it started yesterday.  She has had nonbloody, nonbilious emesis and watery diarrhea.  She has some associated crampy abdominal pain.  She says she has had some noted subjective fevers and chills with some body aches.  No cough or cold symptoms.  Her dad recently was seen in the ED for a sore throat and she thinks he had strep throat.  She denies any urinary symptoms.      Home Medications Prior to Admission medications   Medication Sig Start Date End Date Taking? Authorizing Provider  calcium carbonate (TUMS - DOSED IN MG ELEMENTAL CALCIUM) 500 MG chewable tablet Chew 1 tablet by mouth daily as needed for indigestion or heartburn.   Yes [provider]  ibuprofen (ADVIL) 200 MG tablet Take 200 mg by mouth every 6 (six) hours as needed for mild pain.   Yes [provider]  ondansetron (ZOFRAN-ODT) 4 MG disintegrating tablet 4mg  ODT q4 hours prn nausea/vomit 12/04/21  Yes Malvin Johns, MD  ciprofloxacin (CIPRO) 250 MG tablet Take 1 tablet (250 mg total) by mouth 2 (two) times daily. Patient not taking: Reported on 12/03/2021 06/12/18   Dixie Dials, MD  HYDROcodone-acetaminophen (NORCO/VICODIN) 5-325 MG tablet Take 1 tablet by mouth every 6 (six) hours as needed for severe pain. Patient not taking: Reported on 12/03/2021 09/16/19   Barrie Folk, PA-C  norgestimate-ethinyl estradiol (ORTHO-CYCLEN,SPRINTEC,PREVIFEM) 0.25-35 MG-MCG tablet Take 1 tablet by mouth daily. Patient not taking: Reported on 12/03/2021 06/10/18   Luvenia Redden, PA-C      Allergies    Patient has no known allergies.    Review of Systems   Review of Systems   Constitutional:  Positive for chills, fatigue and fever. Negative for diaphoresis.  HENT:  Negative for congestion, rhinorrhea and sneezing.   Eyes: Negative.   Respiratory:  Negative for cough, chest tightness and shortness of breath.   Cardiovascular:  Negative for chest pain and leg swelling.  Gastrointestinal:  Positive for abdominal pain, diarrhea, nausea and vomiting. Negative for blood in stool.  Genitourinary:  Negative for difficulty urinating, flank pain, frequency and hematuria.  Musculoskeletal:  Negative for arthralgias and back pain.  Skin:  Negative for rash.  Neurological:  Negative for dizziness, speech difficulty, weakness, numbness and headaches.   Physical Exam Updated Vital Signs BP 101/64    Pulse 87    Temp 98.6 F (37 C) (Oral)    Resp 20    Ht 5\' 9"  (1.753 m)    Wt 62.9 kg    LMP 11/11/2021    SpO2 98%    BMI 20.48 kg/m  Physical Exam Constitutional:      Appearance: She is well-developed.  HENT:     Head: Normocephalic and atraumatic.  Eyes:     Pupils: Pupils are equal, round, and reactive to light.  Cardiovascular:     Rate and Rhythm: Normal rate and regular rhythm.     Heart sounds: Normal heart sounds.  Pulmonary:     Effort: Pulmonary effort is normal. No respiratory distress.     Breath sounds: Normal breath sounds. No wheezing or rales.  Chest:  Chest wall: No tenderness.  Abdominal:     General: Bowel sounds are normal.     Palpations: Abdomen is soft.     Tenderness: There is no abdominal tenderness. There is no guarding or rebound.  Musculoskeletal:        General: Normal range of motion.     Cervical back: Normal range of motion and neck supple.  Lymphadenopathy:     Cervical: No cervical adenopathy.  Skin:    General: Skin is warm and dry.     Findings: No rash.  Neurological:     Mental Status: She is alert and oriented to person, place, and time.    ED Results / Procedures / Treatments   Labs (all labs ordered are listed,  but only abnormal results are displayed) Labs Reviewed  CBC WITH DIFFERENTIAL/PLATELET - Abnormal; Notable for the following components:      Result Value   RDW 11.3 (*)    Lymphs Abs 0.3 (*)    All other components within normal limits  COMPREHENSIVE METABOLIC PANEL - Abnormal; Notable for the following components:   Sodium 133 (*)    CO2 21 (*)    Glucose, Bld 108 (*)    Calcium 8.4 (*)    All other components within normal limits  URINALYSIS, ROUTINE W REFLEX MICROSCOPIC - Abnormal; Notable for the following components:   APPearance CLOUDY (*)    Specific Gravity, Urine >1.030 (*)    Ketones, ur 40 (*)    Protein, ur 30 (*)    Leukocytes,Ua TRACE (*)    All other components within normal limits  URINALYSIS, MICROSCOPIC (REFLEX) - Abnormal; Notable for the following components:   Bacteria, UA FEW (*)    All other components within normal limits  RESP PANEL BY RT-PCR (FLU A&B, COVID) ARPGX2  I-STAT BETA HCG BLOOD, ED ([facility], [facility], [facility] ONLY)    EKG None  Radiology No results found.  Procedures Procedures    Medications Ordered in ED Medications  ondansetron (ZOFRAN-ODT) disintegrating tablet 4 mg (4 mg Oral Given 12/03/21 2222)  sodium chloride 0.9 % bolus 1,000 mL (0 mLs Intravenous Stopped 12/04/21 0937)  ondansetron (ZOFRAN) injection 4 mg (4 mg Intravenous Given 12/04/21 [id number])    ED Course/ Medical Decision Making/ A&P                           Medical Decision Making  Patient is a 21 year old female who presents with nausea vomiting and diarrhea.  She was given IV fluids and antiemetics.  She feels much better after this and is able to tolerate oral fluids.  Repeat abdominal exam is benign.  Her labs are nonconcerning.  Her urine appears to be consistent with a dirty specimen.  Her pregnancy test is negative.  She does not have any suggestions of an acute abdomen or surgical issue such as appendicitis or cholecystitis.  She was discharged home in good condition.  She was  given prescription for Zofran.  At this point I do not feel that hospitalization is indicated.  She was given instructions on a clear liquid diet for the next 24 hours.  Return precautions were given.  Final Clinical Impression(s) / ED Diagnoses Final diagnoses:  Gastroenteritis    Rx / DC Orders ED Discharge Orders          Ordered    ondansetron (ZOFRAN-ODT) 4 MG disintegrating tablet        12/04/21 1151  Malvin Johns, MD 12/04/21 1209

## 2021-12-04 NOTE — ED Notes (Signed)
RN reviewed discharge instructions w/ pt. Follow up and prescriptions reviewed, pt had no further questions °
